# Patient Record
Sex: Female | Born: 1975 | Race: White | Hispanic: No | Marital: Single | Smoking: Never smoker
Health system: Southern US, Community
[De-identification: ages and names within clinical notes are randomized; demographics above are authoritative.]

## PROBLEM LIST (undated history)

## (undated) DIAGNOSIS — F419 Anxiety disorder, unspecified: Secondary | ICD-10-CM

## (undated) HISTORY — PX: BREAST ENHANCEMENT SURGERY: SHX7

---

## 2012-09-03 ENCOUNTER — Emergency Department (HOSPITAL_COMMUNITY): Payer: Worker's Compensation

## 2012-09-03 ENCOUNTER — Emergency Department (HOSPITAL_COMMUNITY)
Admission: EM | Admit: 2012-09-03 | Discharge: 2012-09-04 | Disposition: A | Discharge status: Home / Self Care | Payer: Worker's Compensation | Attending: Emergency Medicine | Admitting: Emergency Medicine

## 2012-09-03 ENCOUNTER — Encounter (HOSPITAL_COMMUNITY): Payer: Self-pay | Admitting: Emergency Medicine

## 2012-09-03 DIAGNOSIS — W010XXA Fall on same level from slipping, tripping and stumbling without subsequent striking against object, initial encounter: Secondary | ICD-10-CM | POA: Insufficient documentation

## 2012-09-03 DIAGNOSIS — Y9229 Other specified public building as the place of occurrence of the external cause: Secondary | ICD-10-CM | POA: Insufficient documentation

## 2012-09-03 DIAGNOSIS — S52509A Unspecified fracture of the lower end of unspecified radius, initial encounter for closed fracture: Secondary | ICD-10-CM | POA: Insufficient documentation

## 2012-09-03 DIAGNOSIS — Y9389 Activity, other specified: Secondary | ICD-10-CM | POA: Insufficient documentation

## 2012-09-03 DIAGNOSIS — S52501A Unspecified fracture of the lower end of right radius, initial encounter for closed fracture: Secondary | ICD-10-CM

## 2012-09-03 DIAGNOSIS — R229 Localized swelling, mass and lump, unspecified: Secondary | ICD-10-CM | POA: Insufficient documentation

## 2012-09-03 DIAGNOSIS — F411 Generalized anxiety disorder: Secondary | ICD-10-CM | POA: Insufficient documentation

## 2012-09-03 DIAGNOSIS — S52611A Displaced fracture of right ulna styloid process, initial encounter for closed fracture: Secondary | ICD-10-CM

## 2012-09-03 MED ORDER — HYDROMORPHONE HCL PF 1 MG/ML IJ SOLN
1.0000 mg | Freq: Once | INTRAMUSCULAR | Status: AC
  Administered 2012-09-03: 1 mg via INTRAVENOUS
  Filled 2012-09-03: qty 1

## 2012-09-03 MED ORDER — FENTANYL CITRATE 0.05 MG/ML IJ SOLN
50.0000 ug | Freq: Once | INTRAMUSCULAR | Status: AC
  Administered 2012-09-03: 50 ug via INTRAVENOUS

## 2012-09-03 MED ORDER — LIDOCAINE HCL 2 % IJ SOLN
INTRAMUSCULAR | Status: AC
  Filled 2012-09-03: qty 20

## 2012-09-03 MED ORDER — FENTANYL CITRATE 0.05 MG/ML IJ SOLN
INTRAMUSCULAR | Status: AC
  Filled 2012-09-03: qty 2

## 2012-09-03 MED ORDER — ONDANSETRON HCL 4 MG/2ML IJ SOLN
4.0000 mg | INTRAMUSCULAR | Status: AC
  Administered 2012-09-03: 4 mg via INTRAVENOUS
  Filled 2012-09-03: qty 2

## 2012-09-03 NOTE — ED Notes (Signed)
Pt states that she was on stage at [facility] and fell on water and landed on her R wrist. Obvious gross deformity noted.

## 2012-09-03 NOTE — ED Notes (Signed)
Pt returns from xray

## 2012-09-03 NOTE — ED Provider Notes (Signed)
Dr Merlyn Lot, hand on call, is coming to see patient.   Ward Givens, MD 09/03/12 2311

## 2012-09-03 NOTE — ED Notes (Signed)
ZOX:[id number]<AV> Expected date:<BR> Expected time:<BR> Means of arrival:<BR> Comments:<BR> Triage 5

## 2012-09-03 NOTE — ED Provider Notes (Signed)
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  PT has obvious deformity of her right wrist, intact sensation, good pulse  Medical screening examination/treatment/procedure(s) were conducted as a shared visit with non-physician practitioner(s) and myself.  I personally evaluated the patient during the encounter  Devoria Albe, MD, Franz Dell, MD 09/03/12 2256

## 2012-09-03 NOTE — ED Provider Notes (Signed)
History     CSN: [id number]  Arrival date & time 09/03/12  2142   First MD Initiated Contact with Patient 09/03/12 2210      Chief Complaint  Patient presents with  . Wrist Injury    (Consider location/radiation/quality/duration/timing/severity/associated sxs/prior treatment) HPI Comments: Patient is a 37 year old female with no significant past medical history who shows up for R wrist pain after slipping on water and falling on stage at [facility]. Patient states she put out her hand to catch herself after slipping. Patient denies hitting her head and LOC. Patient admits to constant, severe pain in her R wrist that is nonradiating. Patient denies numbness, pallor, and paresthesias in her RUE.  Patient is a 37 y.o. female presenting with wrist injury. The history is provided by the patient. No language interpreter was used.  Wrist Injury Associated symptoms: no fever     History reviewed. No pertinent past medical history.  Past Surgical History  Procedure Laterality Date  . Breast enhancement surgery      No family history on file.  History  Substance Use Topics  . Smoking status: Never Smoker   . Smokeless tobacco: Not on file  . Alcohol Use: No    OB History   Grav Para Term Preterm Abortions TAB SAB Ect Mult Living                  Review of Systems  Constitutional: Negative for fever and chills.  Respiratory: Negative for shortness of breath.   Cardiovascular: Negative for chest pain.  Musculoskeletal: Positive for joint swelling and arthralgias.  Skin: Negative for color change and pallor.  Neurological: Negative for syncope and numbness.  All other systems reviewed and are negative.    Allergies  Review of patient's allergies indicates no known allergies.  Home Medications   Current Outpatient Rx  Name  Route  Sig  Dispense  Refill  . propranolol (INDERAL) 20 MG tablet   Oral   Take 20 mg by mouth as needed. Before a show for performance anxiety            BP 102/65  Pulse 62  Temp(Src) 98.3 F (36.8 C) (Oral)  Resp 14  SpO2 100%  LMP 08/25/2012  Physical Exam  Nursing note and vitals reviewed. Constitutional: She is oriented to person, place, and time. She appears well-developed and well-nourished. No distress.  HENT:  Head: Normocephalic and atraumatic.  Mouth/Throat: Oropharynx is clear and moist. No oropharyngeal exudate.  Eyes: Conjunctivae are normal. Pupils are equal, round, and reactive to light. No scleral icterus.  Neck: Normal range of motion. Neck supple.  Cardiovascular: Normal rate, regular rhythm and normal heart sounds.   Distal radial pulses 2+ b/l. Capillary refill <2 seconds in b/l upper extremities  Pulmonary/Chest: Effort normal and breath sounds normal. No respiratory distress. She has no wheezes. She has no rales.  Musculoskeletal:       Right elbow: Normal.      Right wrist: She exhibits decreased range of motion, tenderness, bony tenderness, swelling and deformity. She exhibits no effusion and no laceration.       Right hand: She exhibits normal range of motion, no tenderness, no bony tenderness, normal two-point discrimination, normal capillary refill and no deformity. Normal sensation noted. Normal strength noted.  No pallor, poikilothermia, or paresthesia appreciated on exam of right upper extremity, wrist, and hand.  Neurological: She is alert and oriented to person, place, and time.  Skin: Skin is warm and dry.  She is not diaphoretic. No erythema.  Psychiatric: She has a normal mood and affect.    ED Course  Procedures (including critical care time)  Labs Reviewed - No data to display Dg Wrist Complete Right  09/04/2012  *RADIOLOGY REPORT*  Clinical Data: Status post reduction of the radial fracture.  RIGHT WRIST - COMPLETE 3+ VIEW  Comparison: Wrist radiographs 09/03/2012.  Findings: The comminuted distal radius fracture is reduced. Alignment is near anatomic.  A minimally-displaced ulnar styloid  fracture is more evident.  The carpal bones are intact.  A fiberglass splint is now in place.  IMPRESSION:  1.  Near anatomic reduction of the comminuted distal radial fracture. 2.  Minimally-displaced ulnar styloid fracture.   Original Report Authenticated By: [name], M.D.    Dg Wrist Complete Right  09/03/2012  *RADIOLOGY REPORT*  Clinical Data: Fall.  Right wrist pain.  RIGHT WRIST - COMPLETE 3+ VIEW  Comparison: None available.  Findings: A comminuted distal radius fracture is displaced posteriorly one shaft width.  There is foreshortening of the fracture.  Carpal bones appear to be intact.  IMPRESSION:  1.  Comminuted distal radius fracture with dorsal displacement   Original Report Authenticated By: [name], M.D.      1. Distal radius fracture, right, closed, initial encounter      MDM  Patient presents for R wrist pain after a slip and fall tonight on stage at [facility]. Patient with obvious deformity to R wrist; wrist in palmar flexed position. No pallor, poikilothermia, or paresthesias appreciated on PE and distal radial pulses 2+ b/l. Xray significant for comminuted distal radial fracture with dorsal displacement. Consult in to Dr. Merlyn Lot for evaluation of wrist fracture.  Dr. Merlyn Lot consulted by Dr. Devoria Albe; Dr. Merlyn Lot has agreed to come see the patient to reduce the fracture.  Fracture reduced by Dr. Merlyn Lot. Post reduction xray significant for near anatomic reduction of comminuted distal radial fracture; minimally displaced ulnar styloid fracture now observed. Patient to be d/c with narcotic pain management and instruction to follow up in office with Dr. Merlyn Lot. Patient states comfort and understanding with this d/c plan with no unaddressed concerns.   Filed Vitals:   09/03/12 2143 09/03/12 2324 09/04/12 0111  BP: 119/87 102/65 105/67  Pulse: 88 62   Temp: 98.3 F (36.8 C)    TempSrc: Oral    Resp:  14 14  SpO2: 98% 100% 96%        Antony Madura,  PA-C 09/05/12 1255

## 2012-09-04 ENCOUNTER — Emergency Department (HOSPITAL_COMMUNITY): Payer: Worker's Compensation

## 2012-09-04 ENCOUNTER — Encounter (HOSPITAL_BASED_OUTPATIENT_CLINIC_OR_DEPARTMENT_OTHER): Payer: Self-pay | Admitting: *Deleted

## 2012-09-04 ENCOUNTER — Other Ambulatory Visit: Payer: Self-pay | Admitting: Orthopedic Surgery

## 2012-09-04 MED ORDER — HYDROMORPHONE HCL PF 1 MG/ML IJ SOLN
0.5000 mg | Freq: Once | INTRAMUSCULAR | Status: AC
  Administered 2012-09-04: 0.5 mg via INTRAVENOUS
  Filled 2012-09-04: qty 1

## 2012-09-04 MED ORDER — OXYCODONE-ACETAMINOPHEN 5-325 MG PO TABS: 1.0000 | ORAL_TABLET | ORAL | Status: DC | PRN

## 2012-09-04 NOTE — Consult Note (Signed)
Andrea [name], Andrea [name]             ACCOUNT NO.:  [id number]  MEDICAL RECORD NO.:  [id number]  LOCATION:  ZO10                         FACILITY:  [facility]  PHYSICIAN:  Betha Loa, MD        DATE OF BIRTH:  1975-07-16  DATE OF CONSULTATION:  09/03/2012 DATE OF DISCHARGE:  09/04/2012                                CONSULTATION   Consult is for Dr. Lynelle Doctor at [facility] Emergency Department.  Consult is for right distal radius fracture.  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  She had pain and deformity at the wrist.  She was brought to [facility] Emergency Department where radiographs were taken revealing right distal radius fracture.  I was consulted for management of injury.  She reports no previous injuries to the right wrist and no other injuries at this time.  ALLERGIES:  No known drug allergies.  PAST MEDICAL HISTORY:  None.  PAST SURGICAL HISTORY:  Breast augmentation.  MEDICATIONS:  None.  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  She does not smoke and drinks alcohol only occasionally.  REVIEW OF SYSTEMS:  Thirteen point review of systems is negative.  PHYSICAL EXAMINATION:  GENERAL:  Alert and oriented x3, well developed, well nourished.  She is resting comfortably in the hospital stretcher. EXTREMITIES:  Bilateral upper extremities are intact to light touch sensation and capillary refill in the fingertips.  She can flex and extend the IP joint of her thumbs and cross her fingers.  Left upper extremity is without wounds, without tenderness to palpation.  Right upper extremity, she has no wounds.  She is tender to palpation at the distal radius.  She is not tender in her forearm, elbow, or digits. Compartments are soft.  Skin is intact.  There is visible deformity at the wrist.  RADIOGRAPHS:  AP, lateral, and oblique  views of the wrist show a right distal radius fracture with 100% dorsal displacement as well as dorsal angulation.  I discussed with Ms [name] and her fiance the nature of the injury.  We discussed treatment options including nonoperative and operative treatment options.  I recommend a closed reduction in the emergency department under hematoma block.  Risks, benefits, and alternatives of reduction were discussed including the risks of blood loss, infection, damage to deeper structures.  She voiced understanding of these risks and elected to proceed.  PROCEDURE NOTE:  Hematoma block was performed under sterile conditions with chlorhexidine and alcohol prep.  A 10 mL of 2% plain lidocaine was used.  This was adequate to give improved anesthesia at the wrist. Closed reduction was performed.  Sugar-tong splint was placed.  She had improvement in her pain after reduction.  She was able to wiggle all fingers.  She can feel all digits.  She stated the small finger felt a little bit funny, however.  She had brisk capillary refill in all fingertips.  Sugar-tong splint was placed and wrapped with an Ace bandage.  Postreduction radiographs were pending.  I will see her back in the  office tomorrow for preoperative planning.  She will be given pain medications per the emergency department.  She tolerated procedure well.     Betha Loa, MD     KK/MEDQ  D:  09/04/2012  T:  09/04/2012  Job:  [id number]

## 2012-09-05 ENCOUNTER — Ambulatory Visit (HOSPITAL_BASED_OUTPATIENT_CLINIC_OR_DEPARTMENT_OTHER): Payer: BC Managed Care – PPO | Admitting: Anesthesiology

## 2012-09-05 ENCOUNTER — Encounter (HOSPITAL_BASED_OUTPATIENT_CLINIC_OR_DEPARTMENT_OTHER)
Admission: RE | Disposition: A | Discharge status: Home / Self Care | Payer: Self-pay | Source: Ambulatory Visit | Attending: Orthopedic Surgery

## 2012-09-05 ENCOUNTER — Encounter (HOSPITAL_BASED_OUTPATIENT_CLINIC_OR_DEPARTMENT_OTHER): Payer: Self-pay | Admitting: *Deleted

## 2012-09-05 ENCOUNTER — Encounter (HOSPITAL_BASED_OUTPATIENT_CLINIC_OR_DEPARTMENT_OTHER): Payer: Self-pay | Admitting: Anesthesiology

## 2012-09-05 ENCOUNTER — Ambulatory Visit (HOSPITAL_BASED_OUTPATIENT_CLINIC_OR_DEPARTMENT_OTHER)
Admission: RE | Admit: 2012-09-05 | Discharge: 2012-09-05 | Disposition: A | Discharge status: Home / Self Care | Payer: BC Managed Care – PPO | Source: Ambulatory Visit | Attending: Orthopedic Surgery | Admitting: Orthopedic Surgery

## 2012-09-05 DIAGNOSIS — S52599A Other fractures of lower end of unspecified radius, initial encounter for closed fracture: Secondary | ICD-10-CM | POA: Insufficient documentation

## 2012-09-05 DIAGNOSIS — F411 Generalized anxiety disorder: Secondary | ICD-10-CM | POA: Insufficient documentation

## 2012-09-05 DIAGNOSIS — W19XXXA Unspecified fall, initial encounter: Secondary | ICD-10-CM | POA: Insufficient documentation

## 2012-09-05 DIAGNOSIS — Z79899 Other long term (current) drug therapy: Secondary | ICD-10-CM | POA: Insufficient documentation

## 2012-09-05 HISTORY — PX: OPEN REDUCTION INTERNAL FIXATION (ORIF) DISTAL RADIAL FRACTURE: SHX5989

## 2012-09-05 HISTORY — DX: Anxiety disorder, unspecified: F41.9

## 2012-09-05 SURGERY — OPEN REDUCTION INTERNAL FIXATION (ORIF) DISTAL RADIUS FRACTURE
Anesthesia: Regional | Site: Wrist | Laterality: Right | Wound class: Clean

## 2012-09-05 MED ORDER — HYDROMORPHONE HCL PF 1 MG/ML IJ SOLN: 0.2500 mg | INTRAMUSCULAR | Status: DC | PRN

## 2012-09-05 MED ORDER — ONDANSETRON HCL 4 MG/2ML IJ SOLN
INTRAMUSCULAR | Status: DC | PRN
  Administered 2012-09-05: 4 mg via INTRAVENOUS

## 2012-09-05 MED ORDER — OXYCODONE HCL 5 MG PO TABS: 5.0000 mg | ORAL_TABLET | Freq: Once | ORAL | Status: DC | PRN

## 2012-09-05 MED ORDER — MIDAZOLAM HCL 5 MG/5ML IJ SOLN
INTRAMUSCULAR | Status: DC | PRN
  Administered 2012-09-05: 1 mg via INTRAVENOUS

## 2012-09-05 MED ORDER — DEXAMETHASONE SODIUM PHOSPHATE 4 MG/ML IJ SOLN
INTRAMUSCULAR | Status: DC | PRN
  Administered 2012-09-05: 4 mg
  Administered 2012-09-05: 10 mg via INTRAVENOUS

## 2012-09-05 MED ORDER — DEXAMETHASONE SODIUM PHOSPHATE 10 MG/ML IJ SOLN
INTRAMUSCULAR | Status: DC | PRN
  Administered 2012-09-05: 10 mg via INTRAVENOUS

## 2012-09-05 MED ORDER — PROMETHAZINE HCL 25 MG/ML IJ SOLN: 6.2500 mg | INTRAMUSCULAR | Status: DC | PRN

## 2012-09-05 MED ORDER — FENTANYL CITRATE 0.05 MG/ML IJ SOLN
INTRAMUSCULAR | Status: DC | PRN
  Administered 2012-09-05 (×3): 25 ug via INTRAVENOUS

## 2012-09-05 MED ORDER — PROPOFOL 10 MG/ML IV BOLUS
INTRAVENOUS | Status: DC | PRN
  Administered 2012-09-05: 150 mg via INTRAVENOUS

## 2012-09-05 MED ORDER — FENTANYL CITRATE 0.05 MG/ML IJ SOLN
50.0000 ug | INTRAMUSCULAR | Status: DC | PRN
  Administered 2012-09-05: 100 ug via INTRAVENOUS

## 2012-09-05 MED ORDER — LIDOCAINE HCL (CARDIAC) 20 MG/ML IV SOLN
INTRAVENOUS | Status: DC | PRN
  Administered 2012-09-05: 30 mg via INTRAVENOUS

## 2012-09-05 MED ORDER — BUPIVACAINE-EPINEPHRINE PF 0.5-1:200000 % IJ SOLN
INTRAMUSCULAR | Status: DC | PRN
  Administered 2012-09-05: 25 mL

## 2012-09-05 MED ORDER — LACTATED RINGERS IV SOLN
INTRAVENOUS | Status: DC
  Administered 2012-09-05 (×2): via INTRAVENOUS

## 2012-09-05 MED ORDER — OXYCODONE HCL 5 MG/5ML PO SOLN: 5.0000 mg | Freq: Once | ORAL | Status: DC | PRN

## 2012-09-05 MED ORDER — VANCOMYCIN HCL 1000 MG IV SOLR: 1000.0000 mg | INTRAVENOUS | Status: DC | PRN

## 2012-09-05 MED ORDER — CHLORHEXIDINE GLUCONATE 4 % EX LIQD: 60.0000 mL | Freq: Once | CUTANEOUS | Status: DC

## 2012-09-05 MED ORDER — EPHEDRINE SULFATE 50 MG/ML IJ SOLN
INTRAMUSCULAR | Status: DC | PRN
  Administered 2012-09-05: 10 mg via INTRAVENOUS

## 2012-09-05 MED ORDER — CEFAZOLIN SODIUM-DEXTROSE 2-3 GM-% IV SOLR
2.0000 g | INTRAVENOUS | Status: AC
  Administered 2012-09-05: 2 g via INTRAVENOUS

## 2012-09-05 MED ORDER — MIDAZOLAM HCL 2 MG/2ML IJ SOLN
1.0000 mg | INTRAMUSCULAR | Status: DC | PRN
  Administered 2012-09-05: 2 mg via INTRAVENOUS

## 2012-09-05 SURGICAL SUPPLY — 66 items
BANDAGE ELASTIC 3 VELCRO ST LF (GAUZE/BANDAGES/DRESSINGS) ×2 IMPLANT
BANDAGE GAUZE ELAST BULKY 4 IN (GAUZE/BANDAGES/DRESSINGS) ×2 IMPLANT
BIT DRILL 2.0 LNG QUCK RELEASE (BIT) ×2 IMPLANT
BIT DRILL 2.8X5 QR DISP (BIT) ×2 IMPLANT
BLADE MINI RND TIP GREEN BEAV (BLADE) IMPLANT
BLADE SURG 15 STRL LF DISP TIS (BLADE) ×2 IMPLANT
BLADE SURG 15 STRL SS (BLADE) ×2
BNDG ESMARK 4X9 LF (GAUZE/BANDAGES/DRESSINGS) ×2 IMPLANT
CHLORAPREP W/TINT 26ML (MISCELLANEOUS) ×2 IMPLANT
CLOTH BEACON ORANGE TIMEOUT ST (SAFETY) ×2 IMPLANT
CORDS BIPOLAR (ELECTRODE) ×2 IMPLANT
COVER MAYO STAND STRL (DRAPES) ×2 IMPLANT
COVER TABLE BACK 60X90 (DRAPES) ×2 IMPLANT
CUFF TOURNIQUET SINGLE 18IN (TOURNIQUET CUFF) ×2 IMPLANT
DRAPE EXTREMITY T 121X128X90 (DRAPE) ×2 IMPLANT
DRAPE OEC MINIVIEW 54X84 (DRAPES) ×2 IMPLANT
DRAPE SURG 17X23 STRL (DRAPES) ×2 IMPLANT
DRILL 2.0 LNG QUICK RELEASE (BIT) ×4
GAUZE XEROFORM 1X8 LF (GAUZE/BANDAGES/DRESSINGS) ×2 IMPLANT
GLOVE BIO SURGEON STRL SZ7.5 (GLOVE) ×4 IMPLANT
GLOVE BIOGEL M STRL SZ7.5 (GLOVE) ×2 IMPLANT
GLOVE BIOGEL PI IND STRL 8 (GLOVE) ×2 IMPLANT
GLOVE BIOGEL PI IND STRL 8.5 (GLOVE) IMPLANT
GLOVE BIOGEL PI INDICATOR 8 (GLOVE) ×2
GLOVE BIOGEL PI INDICATOR 8.5 (GLOVE)
GLOVE SURG ORTHO 8.0 STRL STRW (GLOVE) IMPLANT
GOWN BRE IMP PREV XXLGXLNG (GOWN DISPOSABLE) ×2 IMPLANT
GOWN PREVENTION PLUS XLARGE (GOWN DISPOSABLE) ×2 IMPLANT
GUIDEWIRE ORTHO 0.054X6 (WIRE) ×6 IMPLANT
NEEDLE HYPO 25X1 1.5 SAFETY (NEEDLE) ×2 IMPLANT
NS IRRIG 1000ML POUR BTL (IV SOLUTION) ×2 IMPLANT
PACK BASIN DAY SURGERY FS (CUSTOM PROCEDURE TRAY) ×2 IMPLANT
PAD CAST 3X4 CTTN HI CHSV (CAST SUPPLIES) ×1 IMPLANT
PADDING CAST ABS 4INX4YD NS (CAST SUPPLIES) ×1
PADDING CAST ABS COTTON 4X4 ST (CAST SUPPLIES) ×1 IMPLANT
PADDING CAST COTTON 3X4 STRL (CAST SUPPLIES) ×1
PLATE ACULOCK 2 NARROW RT (Plate) ×2 IMPLANT
SCREW ACTK 2 NL HEX 3.5.11 (Screw) ×2 IMPLANT
SCREW CORT FT 18X2.3XLCK HEX (Screw) ×1 IMPLANT
SCREW CORT FT 20X2.3XLCK HEX (Screw) ×1 IMPLANT
SCREW CORT FT 22X2.3XLCK HEX (Screw) ×1 IMPLANT
SCREW CORTICAL LOCKING 2.3X18M (Screw) ×5 IMPLANT
SCREW CORTICAL LOCKING 2.3X20M (Screw) ×1 IMPLANT
SCREW CORTICAL LOCKING 2.3X22M (Screw) ×1 IMPLANT
SCREW FX18X2.3XSMTH LCK NS CRT (Screw) ×4 IMPLANT
SCREW NLCKG 13 3.5X13 HEXA (Screw) ×1 IMPLANT
SCREW NON LOCK 3.5X10MM (Screw) ×2 IMPLANT
SCREW NON TOGG 2.3X20MM (Screw) ×2 IMPLANT
SCREW NON-LOCK 3.5X13 (Screw) ×2 IMPLANT
SCREW NONLOCK HEX 3.5X12 (Screw) ×2 IMPLANT
SLEEVE SCD COMPRESS KNEE MED (MISCELLANEOUS) ×2 IMPLANT
SPLINT PLASTER CAST XFAST 4X15 (CAST SUPPLIES) ×10 IMPLANT
SPLINT PLASTER XTRA FAST SET 4 (CAST SUPPLIES) ×10
SPONGE GAUZE 4X4 12PLY (GAUZE/BANDAGES/DRESSINGS) ×2 IMPLANT
STOCKINETTE 4X48 STRL (DRAPES) ×2 IMPLANT
SUCTION FRAZIER TIP 10 FR DISP (SUCTIONS) IMPLANT
SUT ETHILON 3 0 PS 1 (SUTURE) IMPLANT
SUT ETHILON 4 0 PS 2 18 (SUTURE) ×4 IMPLANT
SUT VIC AB 3-0 PS1 18 (SUTURE)
SUT VIC AB 3-0 PS1 18XBRD (SUTURE) IMPLANT
SUT VICRYL 4-0 PS2 18IN ABS (SUTURE) ×2 IMPLANT
SYR BULB 3OZ (MISCELLANEOUS) ×2 IMPLANT
SYR CONTROL 10ML LL (SYRINGE) IMPLANT
TOWEL OR 17X24 6PK STRL BLUE (TOWEL DISPOSABLE) ×4 IMPLANT
TUBE CONNECTING 20X1/4 (TUBING) IMPLANT
UNDERPAD 30X30 INCONTINENT (UNDERPADS AND DIAPERS) ×2 IMPLANT

## 2012-09-05 NOTE — Anesthesia Postprocedure Evaluation (Signed)
  Anesthesia Post-op Note  Patient: Andrea [name]  Procedure(s) Performed: Procedure(s): OPEN REDUCTION INTERNAL FIXATION (ORIF) DISTAL RADIAL FRACTURE (Right)  Patient Location: PACU  Anesthesia Type:General and GA combined with regional for post-op pain  Level of Consciousness: awake, alert  and oriented  Airway and Oxygen Therapy: Patient connected to nasal cannula oxygen  Post-op Pain: none  Post-op Assessment: Post-op Vital signs reviewed, Patient's Cardiovascular Status Stable, Respiratory Function Stable, Patent Airway and Pain level controlled  Post-op Vital Signs: stable  Complications: No apparent anesthesia complications

## 2012-09-05 NOTE — Anesthesia Procedure Notes (Addendum)
Anesthesia Regional Block:  Supraclavicular block  Pre-Anesthetic Checklist: ,, timeout performed, Correct Patient, Correct Site, Correct Laterality, Correct Procedure, Correct Position, site marked, Risks and benefits discussed,  Surgical consent,  Pre-op evaluation,  At surgeon's request and post-op pain management  Laterality: Right  Prep: chloraprep       Needles:  Injection technique: Single-shot  Needle Type: Echogenic Stimulator Needle     Needle Length: 5cm 5 cm Needle Gauge: 22 and 22 G    Additional Needles:  Procedures: ultrasound guided (picture in chart) and nerve stimulator Supraclavicular block  Nerve Stimulator or Paresthesia:  Response: 0.5 mA,   Additional Responses:   Narrative:  Start time: 09/05/2012 2:00 PM End time: 09/05/2012 2:09 PM Injection made incrementally with aspirations every 3 mL. Anesthesiologist: Dr Gypsy Balsam  Additional Notes: [id number] R Supraclav Nerve Block POP CHG prep, sterile tech #22 stim/echo needle with good [place] visualization-Pix in chart Vernia Buff .5% w/epi 1:200000 total 25cc+ decadron 4mg  infiltrated Multiple neg asp No compl Dr Gypsy Balsam   Procedure Name: LMA Insertion Date/Time: 09/05/2012 3:28 PM Performed by: Marley Pakula D Pre-anesthesia Checklist: Patient identified, Emergency Drugs available, Suction available and Patient being monitored Patient Re-evaluated:Patient Re-evaluated prior to inductionOxygen Delivery Method: Circle System Utilized Preoxygenation: Pre-oxygenation with 100% oxygen Intubation Type: IV induction Ventilation: Mask ventilation without difficulty LMA: LMA inserted LMA Size: 4.0 Number of attempts: 1 Airway Equipment and Method: bite block Placement Confirmation: positive ETCO2 Tube secured with: Tape Dental Injury: Teeth and Oropharynx as per pre-operative assessment

## 2012-09-05 NOTE — Anesthesia Preprocedure Evaluation (Signed)
Anesthesia Evaluation  Patient identified by MRN, date of birth, ID band Patient awake    Reviewed: Allergy & Precautions, H&P , NPO status , Patient's Chart, lab work & pertinent test results  Airway Mallampati: I TM Distance: >3 FB Neck ROM: Full    Dental   Pulmonary  breath sounds clear to auscultation        Cardiovascular Rhythm:Regular Rate:Normal     Neuro/Psych    GI/Hepatic   Endo/Other    Renal/GU      Musculoskeletal   Abdominal   Peds  Hematology   Anesthesia Other Findings   Reproductive/Obstetrics                           Anesthesia Physical Anesthesia Plan  ASA: I  Anesthesia Plan: General   Post-op Pain Management:    Induction: Intravenous  Airway Management Planned: LMA  Additional Equipment:   Intra-op Plan:   Post-operative Plan: Extubation in OR  Informed Consent: I have reviewed the patients History and Physical, chart, labs and discussed the procedure including the risks, benefits and alternatives for the proposed anesthesia with the patient or authorized representative who has indicated his/her understanding and acceptance.     Plan Discussed with: CRNA and Surgeon  Anesthesia Plan Comments:         Anesthesia Quick Evaluation  

## 2012-09-05 NOTE — Brief Op Note (Signed)
09/05/2012  4:58 PM  PATIENT:  Andrea [name]  37 y.o. female  PRE-OPERATIVE DIAGNOSIS:  right distal radius fracture  POST-OPERATIVE DIAGNOSIS:  right distal radius fracture  PROCEDURE:  Procedure(s): OPEN REDUCTION INTERNAL FIXATION (ORIF) DISTAL RADIAL FRACTURE (Right)  SURGEON:  Surgeon(s) and Role:    * Tami Ribas, MD - Primary  PHYSICIAN ASSISTANT:   ASSISTANTS: none   ANESTHESIA:   regional and general  EBL:  Total I/O In: 1000 [I.V.:1000] Out: -   BLOOD ADMINISTERED:none  DRAINS: none   LOCAL MEDICATIONS USED:  NONE  SPECIMEN:  No Specimen  DISPOSITION OF SPECIMEN:  N/A  COUNTS:  YES  TOURNIQUET:   Total Tourniquet Time Documented: Upper Arm (Right) - 69 minutes Total: Upper Arm (Right) - 69 minutes   DICTATION: .Other Dictation: Dictation Number [id number]  PLAN OF CARE: Discharge to home after PACU  PATIENT DISPOSITION:  PACU - hemodynamically stable.

## 2012-09-05 NOTE — Progress Notes (Signed)
Assisted Dr. Kasik with right, ultrasound guided, supraclavicular block. Side rails up, monitors on throughout procedure. See vital signs in flow sheet. Tolerated Procedure well. 

## 2012-09-05 NOTE — Transfer of Care (Signed)
Immediate Anesthesia Transfer of Care Note  Patient: Andrea [name]  Procedure(s) Performed: Procedure(s): OPEN REDUCTION INTERNAL FIXATION (ORIF) DISTAL RADIAL FRACTURE (Right)  Patient Location: PACU  Anesthesia Type:General  Level of Consciousness: awake, alert  and oriented  Airway & Oxygen Therapy: Patient Spontanous Breathing and Patient connected to face mask oxygen  Post-op Assessment: Report given to PACU RN and Post -op Vital signs reviewed and stable  Post vital signs: Reviewed and stable  Complications: No apparent anesthesia complications

## 2012-09-05 NOTE — Op Note (Signed)
[id number]

## 2012-09-05 NOTE — ED Provider Notes (Signed)
Medical screening examination/treatment/procedure(s) were performed by non-physician practitioner and as supervising physician I was immediately available for consultation/collaboration. Devoria Albe, MD, Armando Gang   Ward Givens, MD 09/05/12 [phone]

## 2012-09-05 NOTE — H&P (Signed)
Andrea [name] is an 37 y.o. female.   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  Seen at [facility] where XR revealed distal radius fracture with displacement.  Closed reduction in ED.  No other injuries at this time.  Past Medical History  Diagnosis Date  . Anxiety     performance anxiety    Past Surgical History  Procedure Laterality Date  . Breast enhancement surgery      History reviewed. No pertinent family history. Social History:  reports that she has never smoked. She does not have any smokeless tobacco history on file. She reports that  drinks alcohol. She reports that she does not use illicit drugs.  Allergies: No Known Allergies  Medications Prior to Admission  Medication Sig Dispense Refill  . HYDROcodone-acetaminophen (NORCO/VICODIN) 5-325 MG per tablet Take 1 tablet by mouth every 6 (six) hours as needed for pain.      Andrea [name] propranolol (INDERAL) 20 MG tablet Take 20 mg by mouth as needed. 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  RIGHT WRIST - COMPLETE 3+ VIEW  Comparison: Wrist radiographs 09/03/2012.  Findings: The comminuted distal radius fracture is reduced. Alignment is near anatomic.  A minimally-displaced ulnar styloid fracture is more evident.  The carpal bones are intact.  A fiberglass splint is now in place.  IMPRESSION:  1.  Near anatomic reduction of the comminuted distal radial fracture. 2.  Minimally-displaced ulnar styloid fracture.   Original Report Authenticated By: [name], M.D.    Dg Wrist Complete  Right  09/03/2012  *RADIOLOGY REPORT*  Clinical Data: Fall.  Right wrist pain.  RIGHT WRIST - COMPLETE 3+ VIEW  Comparison: None available.  Findings: A comminuted distal radius fracture is displaced posteriorly one shaft width.  There is foreshortening of the fracture.  Carpal bones appear to be intact.  IMPRESSION:  1.  Comminuted distal radius fracture with dorsal displacement   Original Report Authenticated By: [name], M.D.      A comprehensive review of systems was negative except for: Eyes: positive for contacts/glasses  Blood pressure 117/75, pulse 66, temperature 98.4 F (36.9 C), temperature source Oral, resp. rate 20, height 5\' 8"  (1.727 m), weight 62.143 kg (137 lb), last menstrual period 08/25/2012, SpO2 100.00%.  General appearance: alert, cooperative and appears stated age Head: Normocephalic, without obvious abnormality, atraumatic Neck: supple, symmetrical, trachea midline Resp: clear to auscultation bilaterally Cardio: regular rate and rhythm GI: non tender Extremities: intact sensation and capillary refill all digits.  +epl/fpl/io.  ttp distal radius.  skin intact. Pulses: 2+ and symmetric Skin: Skin color, texture, turgor normal. No rashes or lesions Neurologic: Grossly normal Incision/Wound: na  Assessment/Plan Right distal radius fracture.  Non operative and operative treatment options were discussed with the patient and patient wishes to proceed with operative treatment. Risks, benefits, and alternatives of surgery were discussed and the patient agrees with the plan of care.   Andrea [name] 09/05/2012, 1:25 PM

## 2012-09-06 NOTE — Op Note (Signed)
Andrea [name], [name]             ACCOUNT NO.:  [id number]  MEDICAL RECORD NO.:  [id number]  LOCATION:                                 FACILITY:  PHYSICIAN:  Betha Loa, MD        DATE OF BIRTH:  [birth year]  DATE OF PROCEDURE:  09/05/2012 DATE OF DISCHARGE:                              OPERATIVE REPORT   PREOPERATIVE DIAGNOSIS:  Right distal radius fracture.  POSTOPERATIVE DIAGNOSIS:  Right distal radius fracture  PROCEDURE:  Open reduction and internal fixation of right distal radius fracture.  SURGEON:  Betha Loa, MD  ASSISTANT:  None.  ANESTHESIA:  General.  IV FLUIDS:  Per anesthesia flow sheet.  ESTIMATED BLOOD LOSS:  Minimal.  COMPLICATIONS:  None.  SPECIMENS:  None.  TOURNIQUET TIME:  69 minutes.  DISPOSITION:  Stable to PACU.  INDICATIONS:  Andrea [name] is a 37 year old right-hand-dominant female who 2 days ago slipped and fell on stage at a play.  She had pain and deformity at the wrist.  She presented to [facility] Department were radiographs were taken revealing distal radius fracture. I was consulted for management of the injury.  Closed reduction was performed in the emergency department.  We discussed nonoperative and operative treatment options.  Risks, benefits and alternatives of the surgery were discussed including the risk of blood loss; infection; damage to nerves, vessels, tendons, ligaments, bone; failure of surgery; need for additional surgery; complications with wound healing; continued pain; nonunion; malunion; stiffness.  She voiced understanding of these risks and elected to proceed.  OPERATIVE COURSE:  After being identified preoperatively by myself, the patient and I agreed upon the procedure and site of procedure.  The surgical site was marked.  The risks, benefits, and alternatives of surgery were reviewed and she wished to proceed.  Surgical consent had been signed.  She was given 2 g of IV Ancef as preoperative  antibiotic prophylaxis.  Regional block was performed by Anesthesia in preoperative holding.  She was transferred to the operating room and placed on the operating room table in supine position with the right upper extremity on an armboard.  General anesthesia was induced by the anesthesiologist. The right upper extremity was prepped and draped in normal sterile orthopedic fashion.  A surgical pause was performed between the surgeons, anesthesia, and operating room staff, and all were in agreement as to the patient, procedure, and site of procedure. Tourniquet at the proximal aspect of the extremity was inflated to 250 mmHg after exsanguination of the limb with an Esmarch bandage.  Standard volar Sherilyn Cooter approach was used.  Bipolar electrocautery was used to obtain hemostasis.  Superficial and deep portions of the FCR tendon sheath were incised and the FCR and FPL swept ulnarly to protect the palmar cutaneous branch of the median nerve.  Brachioradialis was released from the radial side of the radius.  The pronator quadratus was released from the radial side of the radius and elevated with a periosteal elevator.  Fracture site was cleared of soft tissue interposition.  There was some comminution.  It was reduced under direct visualization.  C-arm was used in AP and lateral projections to ensure appropriate reduction, which was  the case.  A narrow volar distal radial locking plate from the Acumed set was selected and secured to the bone with the guidepins.  C-arm was again used in AP and lateral projections to ensure appropriate reduction and position of hardware, which was the case.  A single nonlocking screw was placed in a slotted hole in the shaft of the plate.  Standard AO drilling measuring technique was used.  The distal ulnar sidehole in the head of the plate was filled with a nonlocking screw to bring the bone to the plate.  The remaining holes were filled with locking pegs with the  exception of the radial styloid holes, which were filled with locking screws.  The nonlocking screw was in exchange for a locking peg.  Remaining holes in the shaft of the plate were filled with nonlocking screws.  Good purchase was obtained.  C-arm was used in AP, lateral, and oblique projections to ensure appropriate reduction and position of hardware, which was the case.  There was no intra-articular penetration.  Radial Length, inclination, and volar tilt had been restored.  The wound was copiously irrigated with sterile saline.  The pronator quadratus was repaired back over top of the plate using 4-0 Vicryl suture.  Inverted interrupted Vicryl suture was placed in the subcutaneous tissues and the skin was closed with 4-0 nylon in a horizontal mattress fashion.  The wound was dressed with sterile Xeroform, 4x4s, and wrapped with a Kerlix bandage.  She had full pronation and supination, and the distal radioulnar joint was stable throughout.  A volar splint was placed and wrapped with Kerlix and Ace bandage.  Tourniquet was deflated at 69 minutes.  Fingertips were pink with brisk capillary refill after deflation of the tourniquet.  Operative drapes were broken down and the patient was awoken from anesthesia safely.  She has transferred back to the stretcher and taken to PACU in stable condition.  She was seen back in the office in 1 week for postoperative followup.  She was given a prescription for hydrocodone yesterday.     Betha Loa, MD     KK/MEDQ  D:  09/05/2012  T:  09/06/2012  Job:  [id number]

## 2012-09-09 ENCOUNTER — Encounter (HOSPITAL_BASED_OUTPATIENT_CLINIC_OR_DEPARTMENT_OTHER): Payer: Self-pay | Admitting: Orthopedic Surgery

## 2014-05-30 IMAGING — CR DG WRIST COMPLETE 3+V*R*
4 series · 4 of 4 positions shown · non-contrast
Comparison: None available.

CLINICAL DATA: Fall.  Right wrist pain.

RIGHT WRIST - COMPLETE 3+ VIEW

[x wrist pa right]
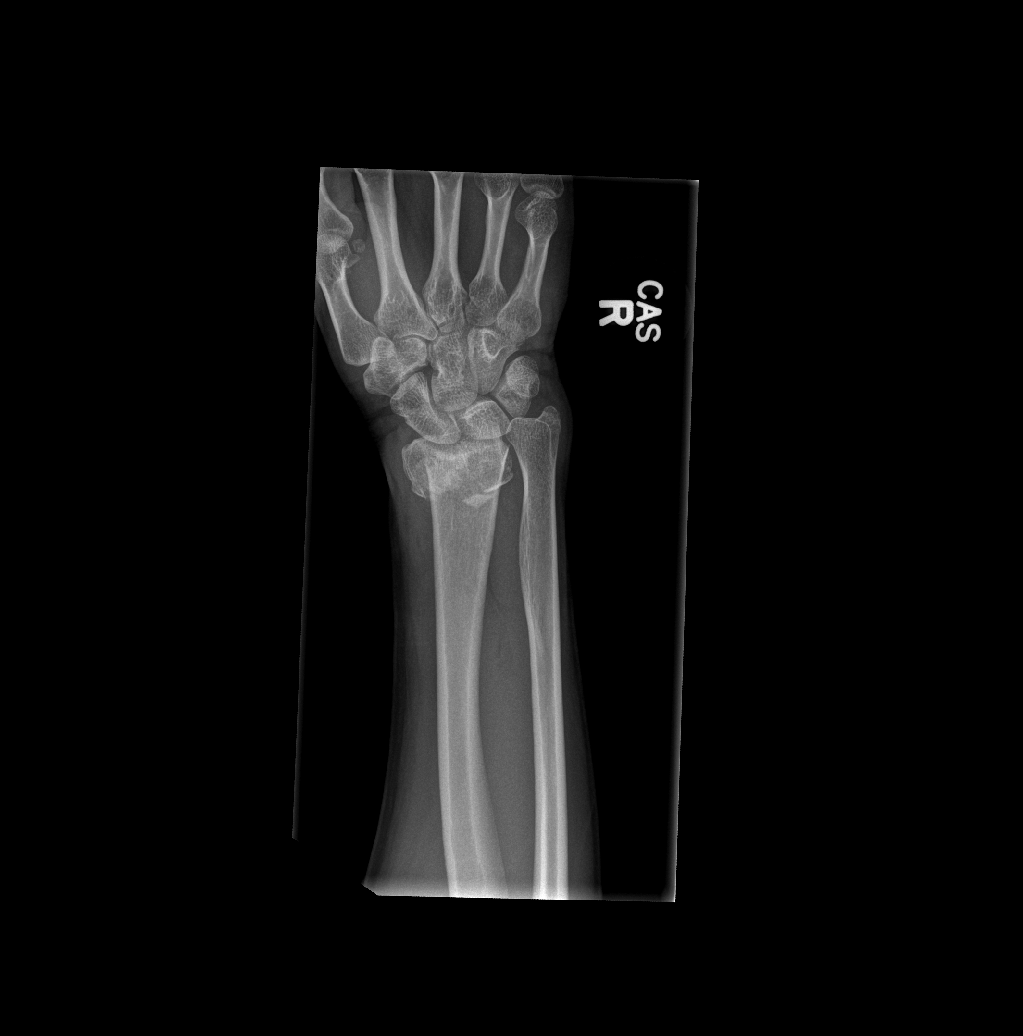

[x wrist obl right]
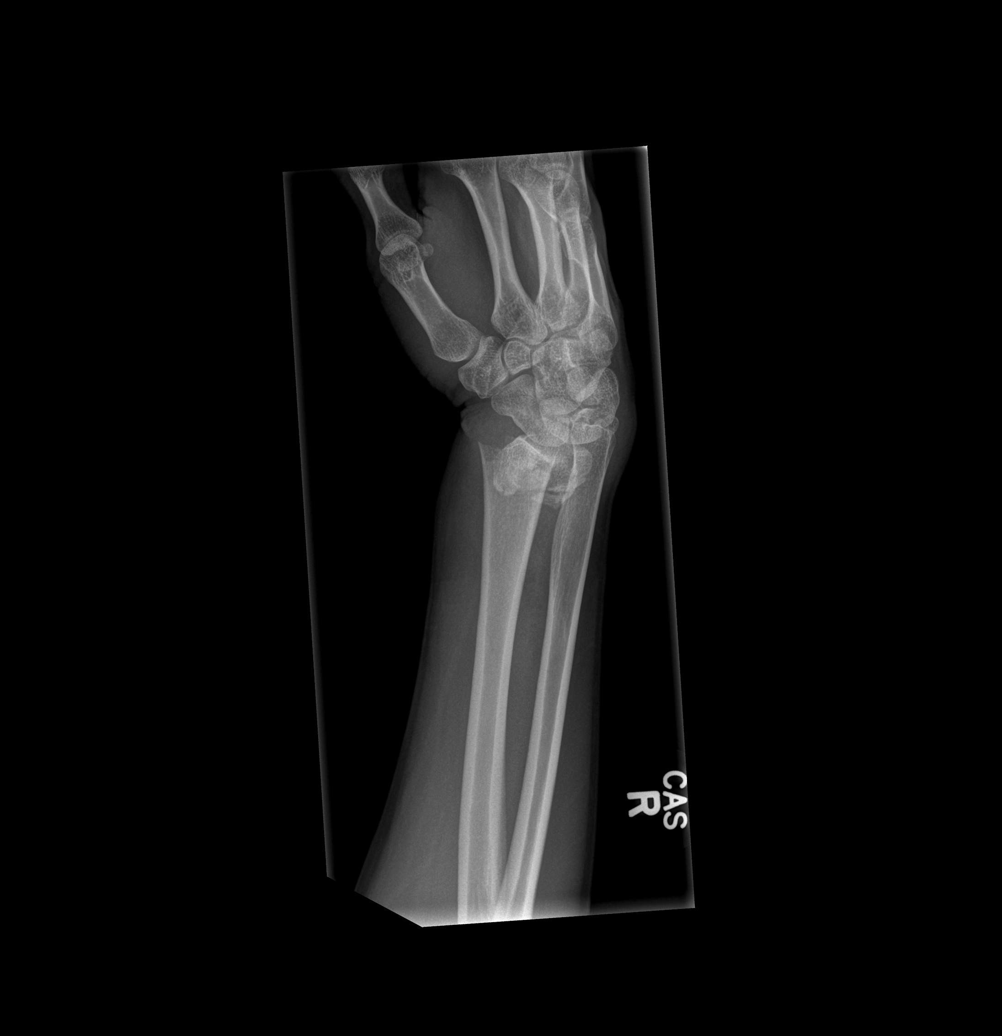

[x wrist lat right]
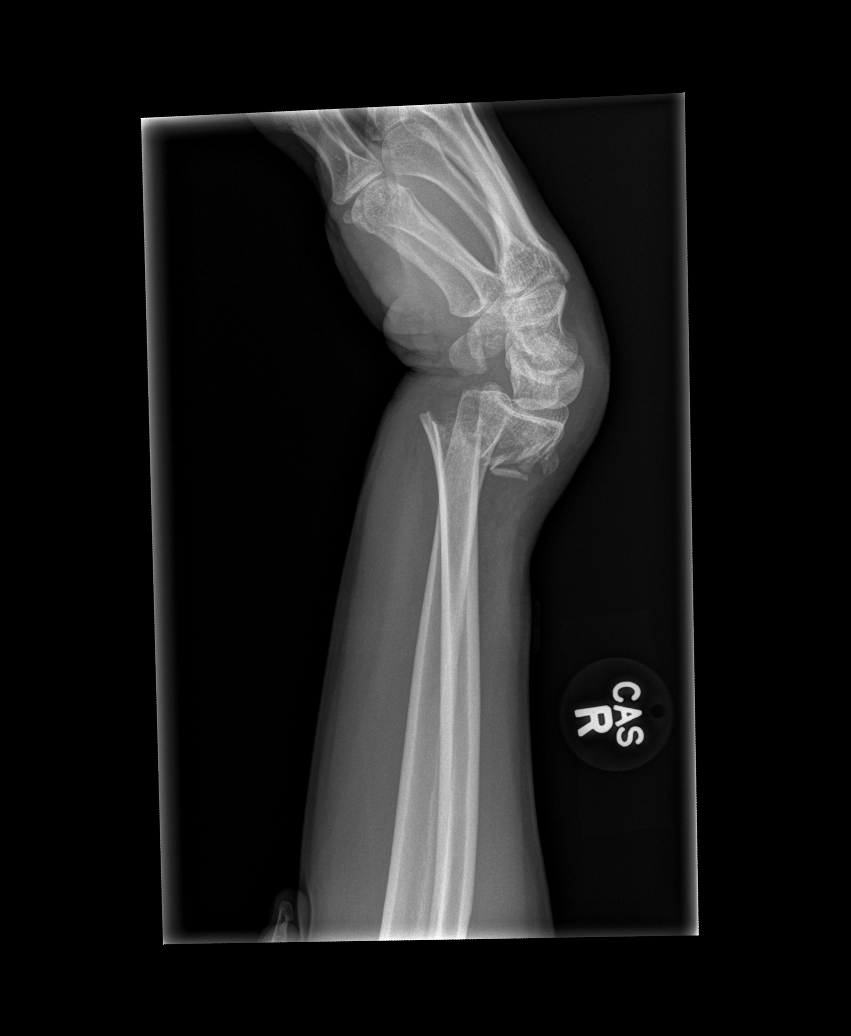

[x wrist navicular view right]
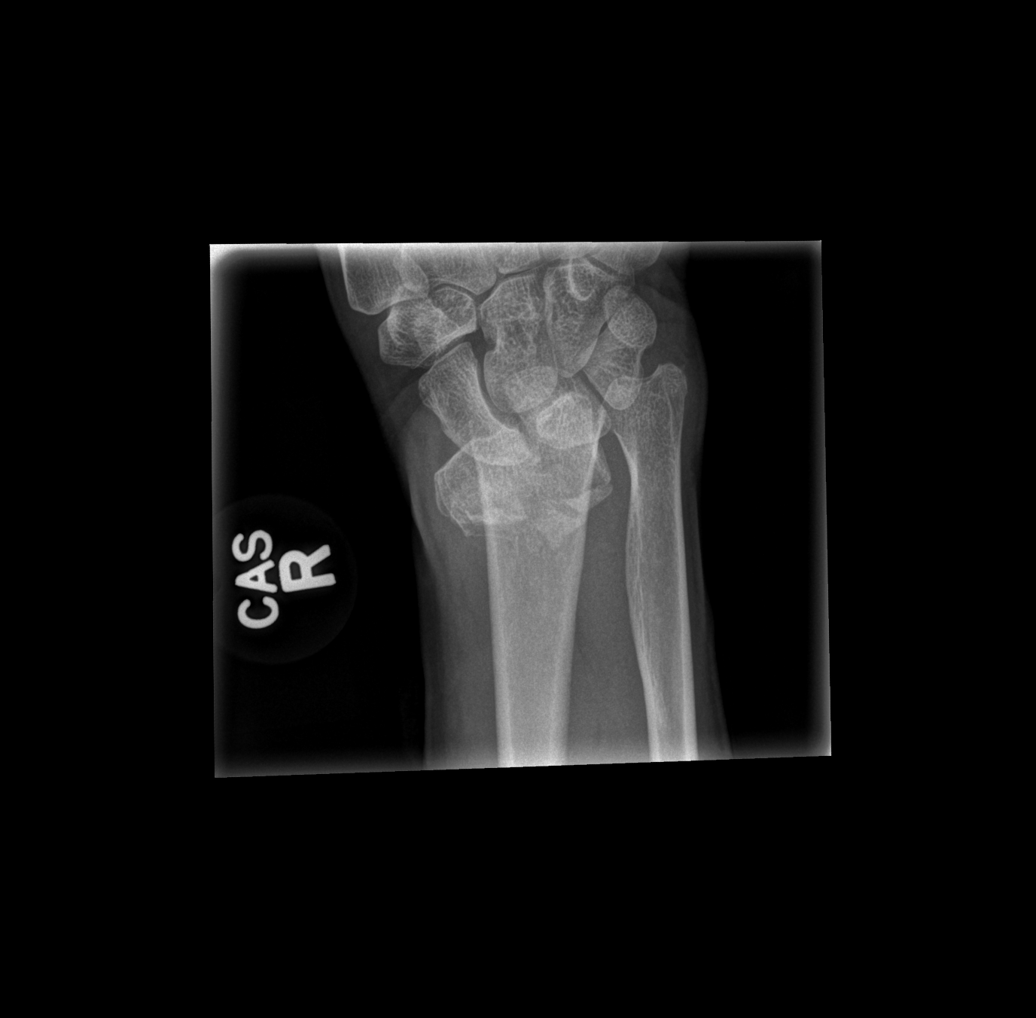

[4 of 4 positions shown; findings below may reference images not displayed]

FINDINGS: A comminuted distal radius fracture is displaced
posteriorly one shaft width.  There is foreshortening of the
fracture.  Carpal bones appear to be intact.
IMPRESSION: 1.  Comminuted distal radius fracture with dorsal displacement

## 2014-05-31 IMAGING — CR DG WRIST COMPLETE 3+V*R*
3 series · 3 of 3 positions shown · non-contrast
Comparison: Wrist radiographs 09/03/2012.

CLINICAL DATA: Status post reduction of the radial fracture.

RIGHT WRIST - COMPLETE 3+ VIEW

[x wrist pa right (1 of 2)]
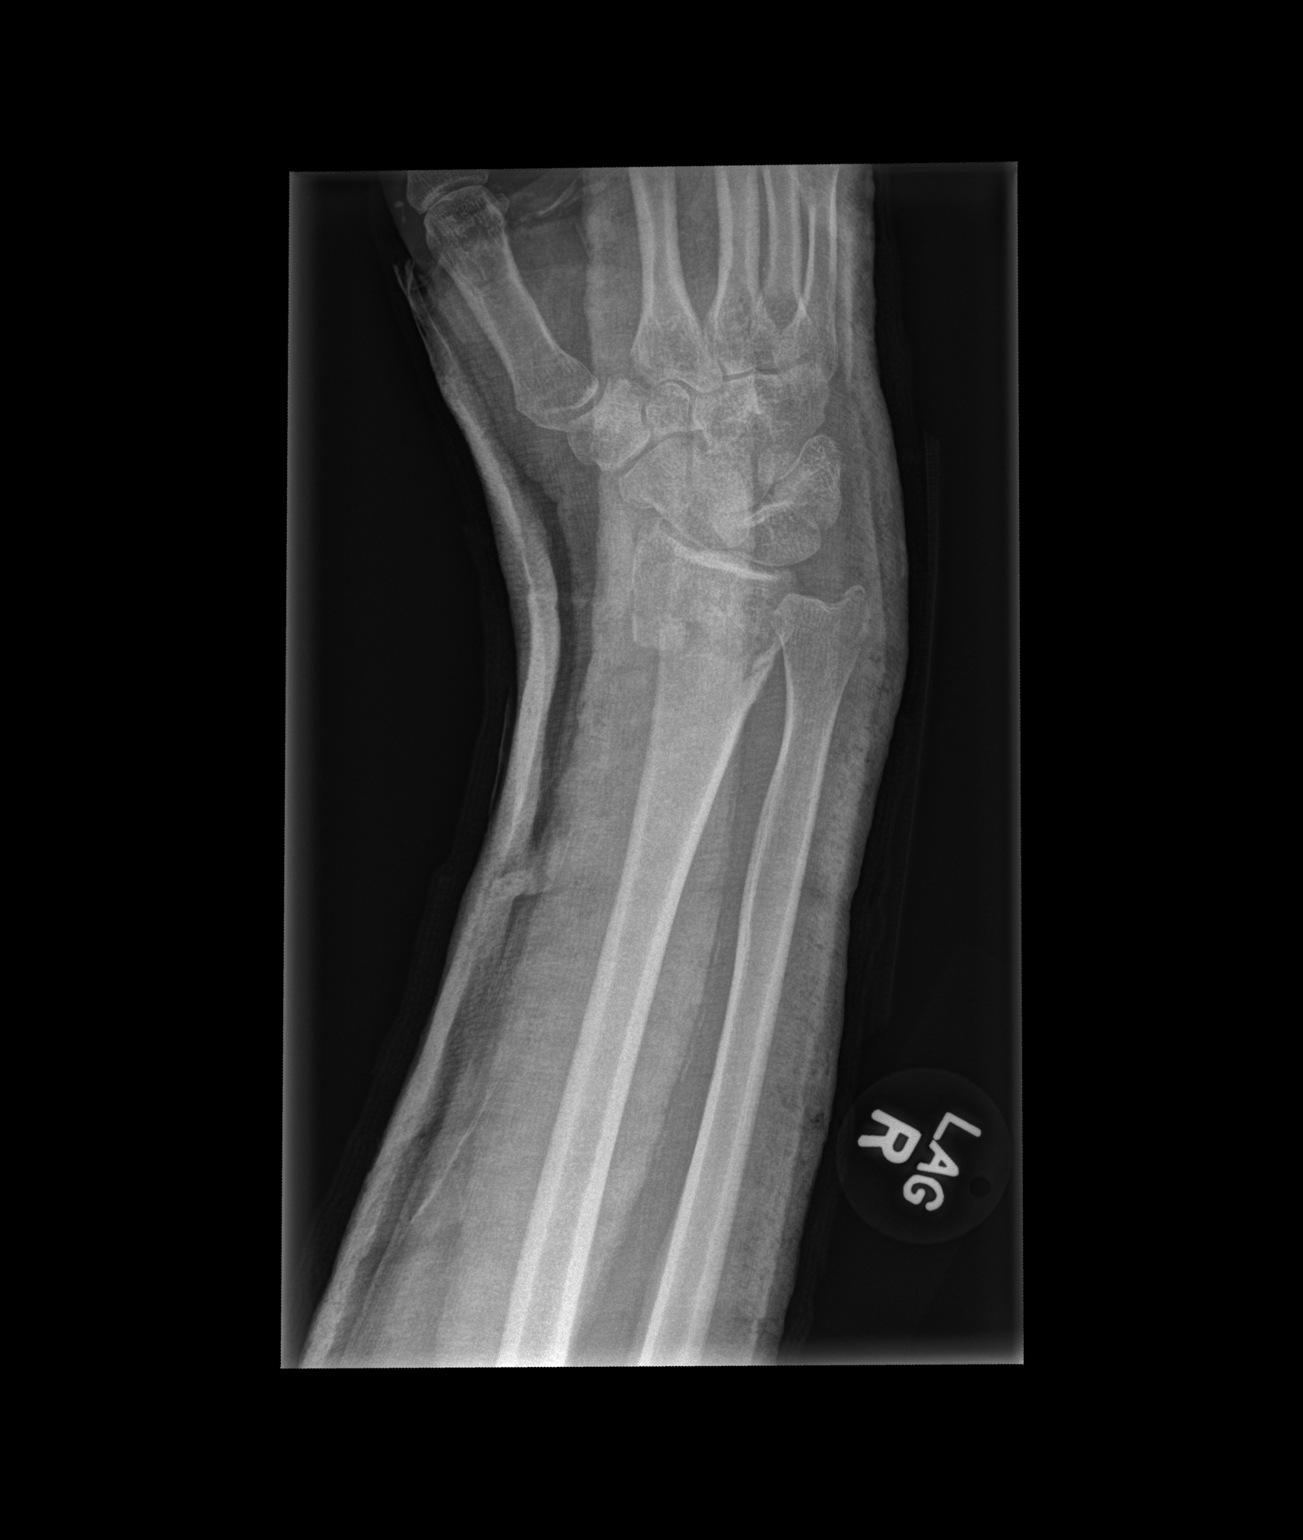

[x wrist lat right]
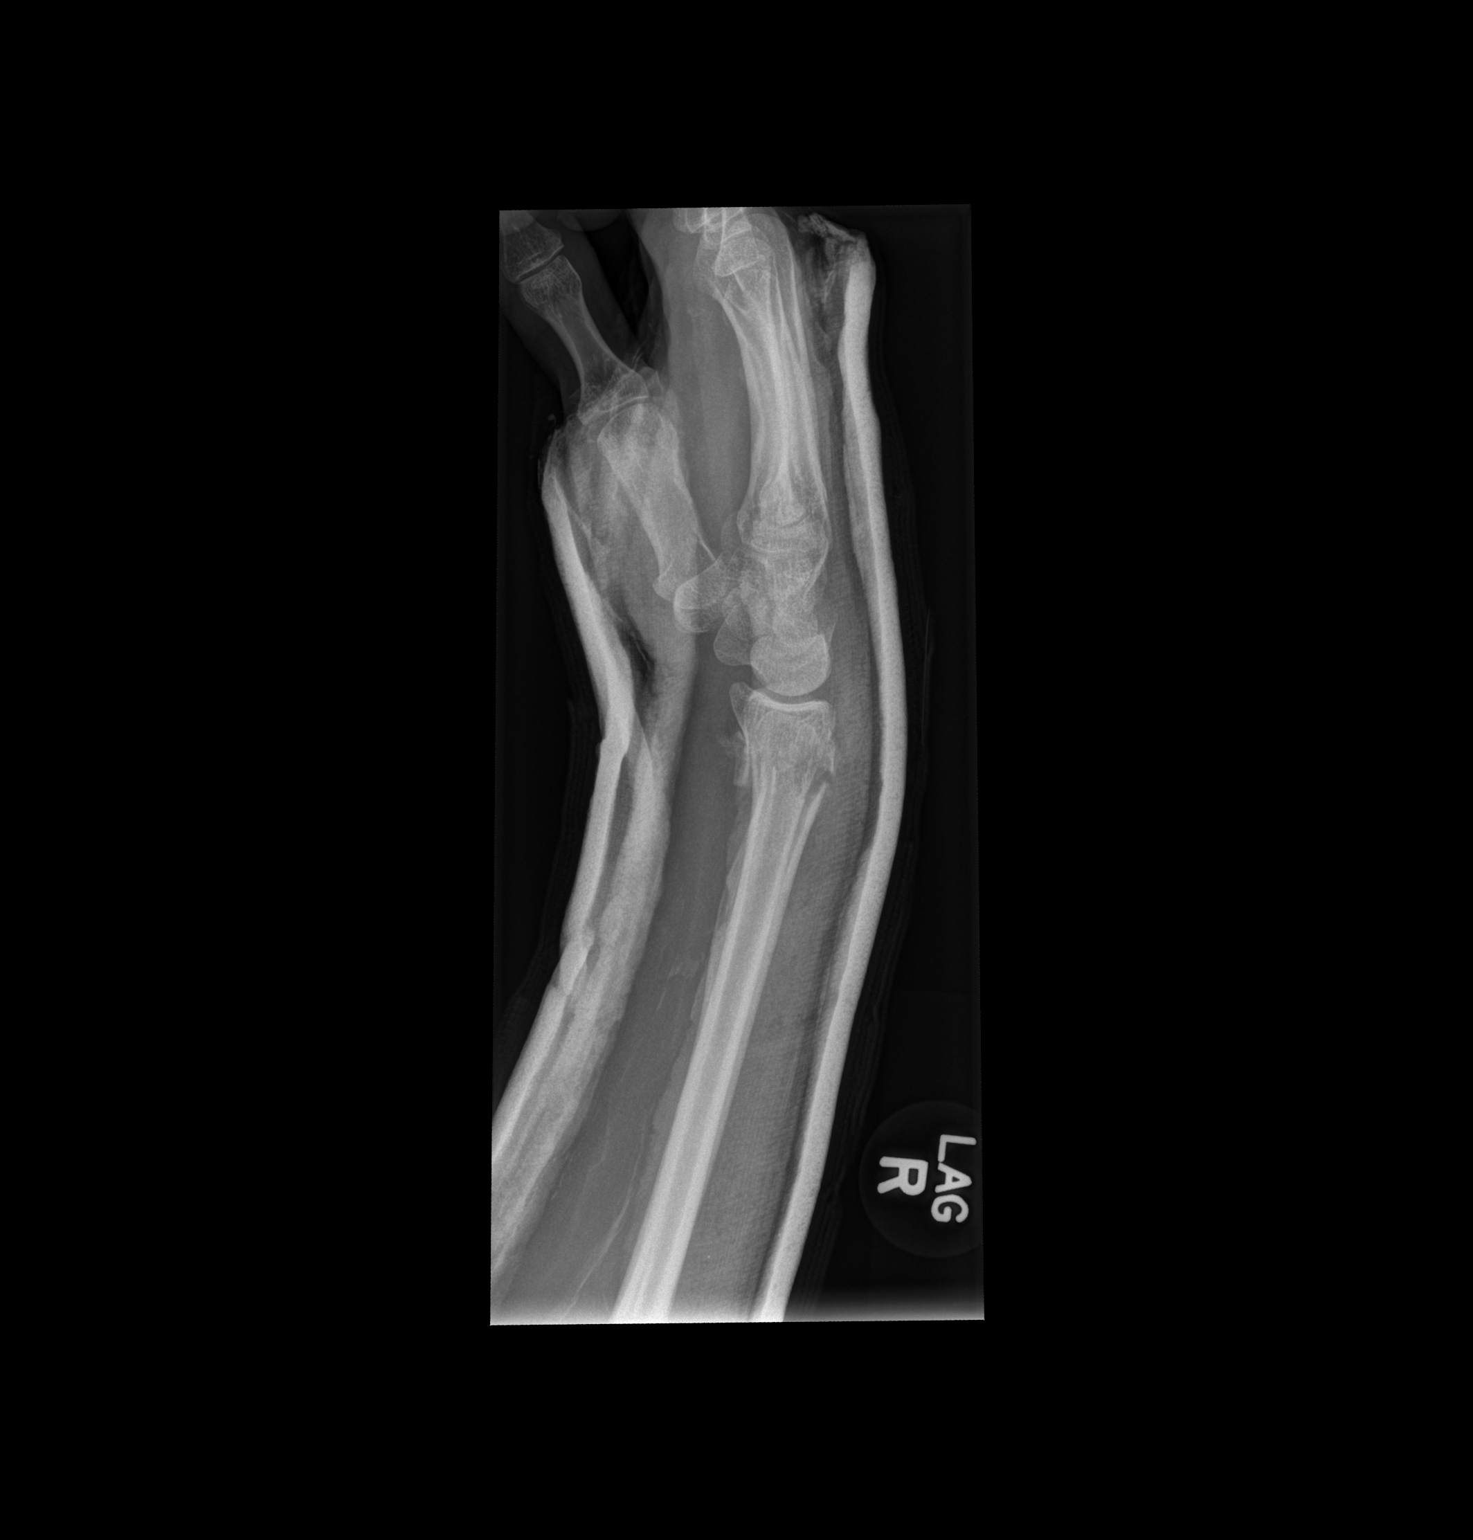

[x wrist pa right (2 of 2)]
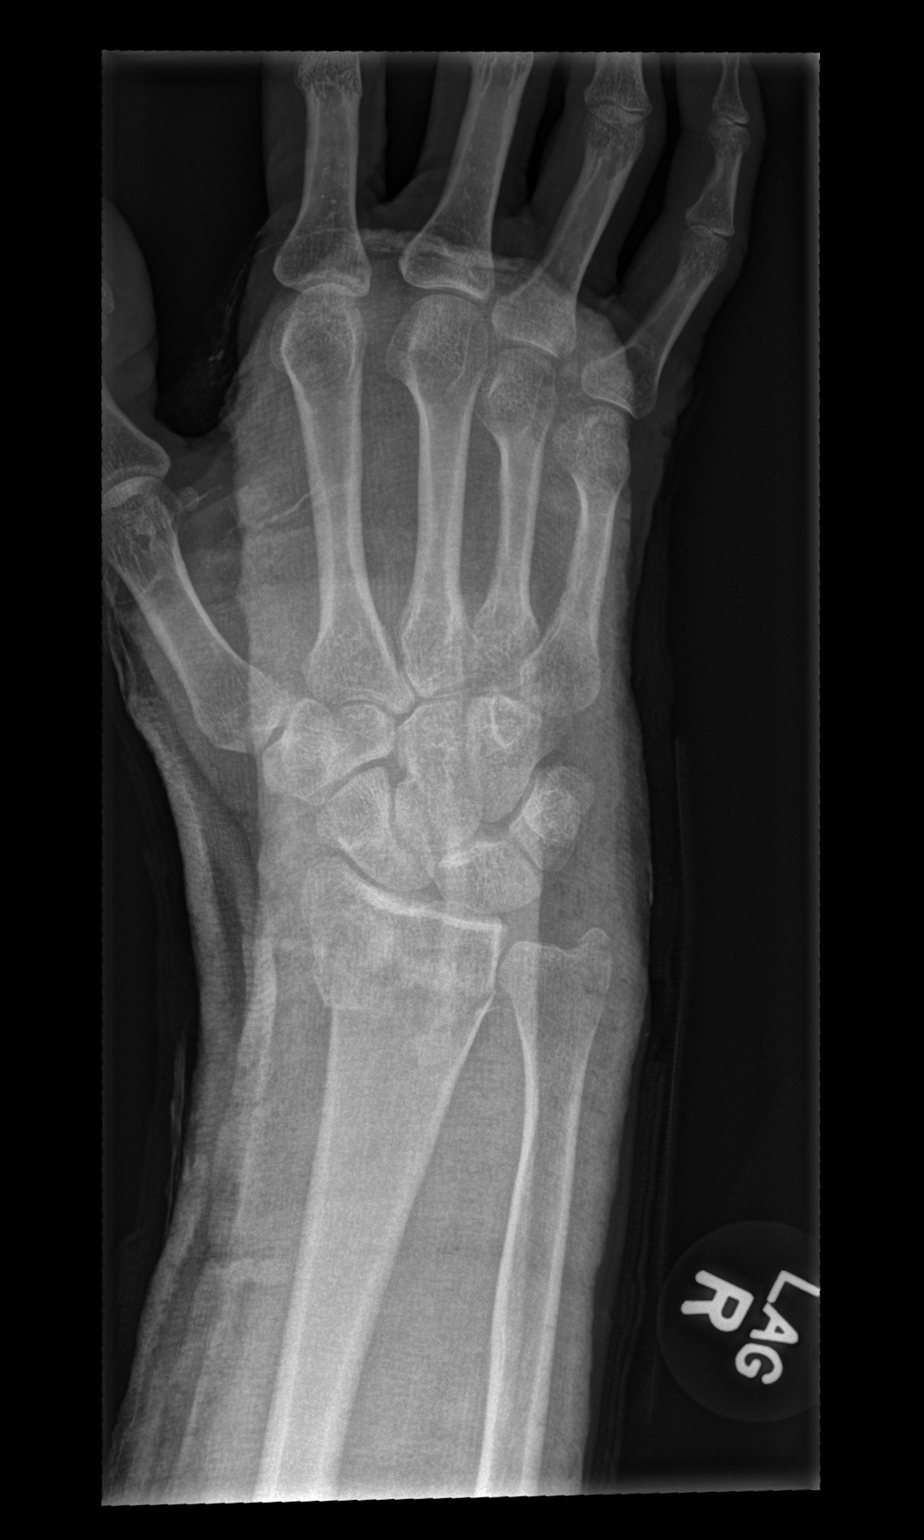

[3 of 3 positions shown; findings below may reference images not displayed]

FINDINGS: The comminuted distal radius fracture is reduced.
Alignment is near anatomic.  A minimally-displaced ulnar styloid
fracture is more evident.  The carpal bones are intact.  A
fiberglass splint is now in place.
IMPRESSION: 1.  Near anatomic reduction of the comminuted distal radial
fracture.
2.  Minimally-displaced ulnar styloid fracture.
